# Patient Record
Sex: Male | Born: 1995 | Race: Black or African American | Hispanic: No | Marital: Single | Smoking: Never smoker
Health system: Southern US, Community
[De-identification: ages and names within clinical notes are randomized; demographics above are authoritative.]

## PROBLEM LIST (undated history)

## (undated) DIAGNOSIS — F909 Attention-deficit hyperactivity disorder, unspecified type: Secondary | ICD-10-CM

## (undated) HISTORY — DX: Attention-deficit hyperactivity disorder, unspecified type: F90.9

## (undated) HISTORY — PX: ROTATOR CUFF REPAIR: SHX139

---
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
## 2018-12-29 ENCOUNTER — Emergency Department (HOSPITAL_BASED_OUTPATIENT_CLINIC_OR_DEPARTMENT_OTHER): Payer: No Typology Code available for payment source

## 2018-12-29 ENCOUNTER — Emergency Department (HOSPITAL_BASED_OUTPATIENT_CLINIC_OR_DEPARTMENT_OTHER)
Admission: EM | Admit: 2018-12-29 | Discharge: 2018-12-29 | Disposition: A | Discharge status: Home / Self Care | Payer: No Typology Code available for payment source | Attending: Emergency Medicine | Admitting: Emergency Medicine

## 2018-12-29 ENCOUNTER — Encounter (HOSPITAL_BASED_OUTPATIENT_CLINIC_OR_DEPARTMENT_OTHER): Payer: Self-pay | Admitting: *Deleted

## 2018-12-29 ENCOUNTER — Other Ambulatory Visit: Payer: Self-pay

## 2018-12-29 DIAGNOSIS — S6391XA Sprain of unspecified part of right wrist and hand, initial encounter: Secondary | ICD-10-CM | POA: Insufficient documentation

## 2018-12-29 DIAGNOSIS — S0990XA Unspecified injury of head, initial encounter: Secondary | ICD-10-CM | POA: Insufficient documentation

## 2018-12-29 DIAGNOSIS — M7918 Myalgia, other site: Secondary | ICD-10-CM

## 2018-12-29 DIAGNOSIS — S60921A Unspecified superficial injury of right hand, initial encounter: Secondary | ICD-10-CM | POA: Diagnosis present

## 2018-12-29 DIAGNOSIS — Y92410 Unspecified street and highway as the place of occurrence of the external cause: Secondary | ICD-10-CM | POA: Diagnosis not present

## 2018-12-29 DIAGNOSIS — Y939 Activity, unspecified: Secondary | ICD-10-CM | POA: Insufficient documentation

## 2018-12-29 DIAGNOSIS — Y999 Unspecified external cause status: Secondary | ICD-10-CM | POA: Diagnosis not present

## 2018-12-29 MED ORDER — METHOCARBAMOL 500 MG PO TABS: 500.0000 mg | ORAL_TABLET | Freq: Two times a day (BID) | ORAL | 0 refills | Status: AC

## 2018-12-29 NOTE — ED Notes (Signed)
Pt ambulated to d/c window with steady gait. Rx x 1 given for Robaxin

## 2018-12-29 NOTE — Discharge Instructions (Signed)

## 2018-12-29 NOTE — ED Notes (Signed)
Pt ambulated to radiology and back without dificulty

## 2018-12-29 NOTE — ED Notes (Signed)
Radiology notified to wait until provider exam to take pt for Xray
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
## 2018-12-29 NOTE — ED Notes (Signed)
ED Provider at bedside. 
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
## 2019-01-24 ENCOUNTER — Emergency Department (HOSPITAL_BASED_OUTPATIENT_CLINIC_OR_DEPARTMENT_OTHER)
Admission: EM | Admit: 2019-01-24 | Discharge: 2019-01-24 | Disposition: A | Discharge status: Home / Self Care | Payer: Self-pay | Attending: Emergency Medicine | Admitting: Emergency Medicine

## 2019-01-24 ENCOUNTER — Other Ambulatory Visit: Payer: Self-pay

## 2019-01-24 ENCOUNTER — Encounter (HOSPITAL_BASED_OUTPATIENT_CLINIC_OR_DEPARTMENT_OTHER): Payer: Self-pay

## 2019-01-24 DIAGNOSIS — Z79899 Other long term (current) drug therapy: Secondary | ICD-10-CM | POA: Insufficient documentation

## 2019-01-24 DIAGNOSIS — R197 Diarrhea, unspecified: Secondary | ICD-10-CM | POA: Insufficient documentation

## 2019-01-24 DIAGNOSIS — R112 Nausea with vomiting, unspecified: Secondary | ICD-10-CM | POA: Insufficient documentation

## 2019-01-24 LAB — CBC WITH DIFFERENTIAL/PLATELET
Abs Immature Granulocytes: 0.02 10*3/uL (ref 0.00–0.07)
Basophils Absolute: 0 10*3/uL (ref 0.0–0.1)
Basophils Relative: 0 %
Eosinophils Absolute: 0.1 10*3/uL (ref 0.0–0.5)
Eosinophils Relative: 1 %
HEMATOCRIT: 46.3 % (ref 39.0–52.0)
Hemoglobin: 14.7 g/dL (ref 13.0–17.0)
Immature Granulocytes: 0 %
LYMPHS ABS: 2.4 10*3/uL (ref 0.7–4.0)
LYMPHS PCT: 23 %
MCH: 28.8 pg (ref 26.0–34.0)
MCHC: 31.7 g/dL (ref 30.0–36.0)
MCV: 90.6 fL (ref 80.0–100.0)
Monocytes Absolute: 0.7 10*3/uL (ref 0.1–1.0)
Monocytes Relative: 7 %
Neutro Abs: 7.3 10*3/uL (ref 1.7–7.7)
Neutrophils Relative %: 69 %
Platelets: 174 10*3/uL (ref 150–400)
RBC: 5.11 MIL/uL (ref 4.22–5.81)
RDW: 13.2 % (ref 11.5–15.5)
WBC: 10.5 10*3/uL (ref 4.0–10.5)
nRBC: 0 % (ref 0.0–0.2)

## 2019-01-24 LAB — COMPREHENSIVE METABOLIC PANEL
ALT: 20 U/L (ref 0–44)
AST: 25 U/L (ref 15–41)
Albumin: 4.5 g/dL (ref 3.5–5.0)
Alkaline Phosphatase: 56 U/L (ref 38–126)
Anion gap: 7 (ref 5–15)
BUN: 15 mg/dL (ref 6–20)
CO2: 27 mmol/L (ref 22–32)
Calcium: 9.3 mg/dL (ref 8.9–10.3)
Chloride: 105 mmol/L (ref 98–111)
Creatinine, Ser: 1.12 mg/dL (ref 0.61–1.24)
GFR calc Af Amer: >60 mL/min (ref >60)
Glucose, Bld: 101 mg/dL — ABNORMAL HIGH (ref 70–99)
Potassium: 3.9 mmol/L (ref 3.5–5.1)
Sodium: 139 mmol/L (ref 135–145)
Total Bilirubin: 0.7 mg/dL (ref 0.3–1.2)
Total Protein: 7 g/dL (ref 6.5–8.1)

## 2019-01-24 LAB — LIPASE, BLOOD: Lipase: 34 U/L (ref 11–51)

## 2019-01-24 MED ORDER — SODIUM CHLORIDE 0.9 % IV BOLUS
1000.0000 mL | Freq: Once | INTRAVENOUS | Status: AC
  Administered 2019-01-24: 1000 mL via INTRAVENOUS

## 2019-01-24 MED ORDER — ONDANSETRON HCL 4 MG/2ML IJ SOLN
4.0000 mg | Freq: Once | INTRAMUSCULAR | Status: AC
  Administered 2019-01-24: 4 mg via INTRAVENOUS
  Filled 2019-01-24: qty 2

## 2019-01-24 MED ORDER — ONDANSETRON 4 MG PO TBDP: ORAL_TABLET | ORAL | 0 refills | Status: AC

## 2019-01-24 NOTE — ED Notes (Signed)
NAD at this time. Pt is stable and going home.  

## 2019-01-24 NOTE — ED Provider Notes (Signed)
[facility] EMERGENCY DEPARTMENT Provider Note   CSN: [id number] Arrival date & time: 01/24/19  1617    History   Chief Complaint Chief Complaint  Patient presents with  . Emesis    HPI Craig Luna is a 23 y.o. male hx of ADHD here presenting with vomiting, diarrhea.  Patient states that for the last 2 days he has been having nonbilious and nonbloody vomiting.  He states that he was unable to keep any food down but did not try to drink much fluids.  Also has several episodes of watery diarrhea as well.  Also has some diffuse abdominal cramps.  Patient denies any recent travel or eating uncooked meat.  Patient states that multiple family members are sick with similar symptoms.  Denies any fevers or previous abdominal surgeries.      The history is provided by the patient.    Past Medical History:  Diagnosis Date  . ADHD (attention deficit hyperactivity disorder)    On Concerta. His grandmother, who is guardian, does not want him to be told that he has ADHD.     Patient Active Problem List   Diagnosis Date Noted  . ACNE, MILD 11/27/2010  . ALLERGIC CONJUNCTIVITIS 03/01/2008  . ATTENTION DEFICIT, W/HYPERACTIVITY 02/03/2007    Past Surgical History:  Procedure Laterality Date  . ROTATOR CUFF REPAIR          Home Medications    Prior to Admission medications   Medication Sig Start Date End Date Taking? Authorizing Provider  methocarbamol (ROBAXIN) 500 MG tablet Take 1 tablet (500 mg total) by mouth 2 (two) times daily. 12/29/18   Maxwell Caul, PA-C  methylphenidate (CONCERTA) 54 MG CR tablet Take 1 tablet (54 mg total) by mouth every morning. 05/26/13   Everrett Coombe, DO  methylphenidate (CONCERTA) 54 MG CR tablet Take 1 tablet (54 mg total) by mouth every morning. 05/26/13   Everrett Coombe, DO  methylphenidate (CONCERTA) 54 MG CR tablet Take 1 tablet (54 mg total) by mouth every morning. 05/26/13   Everrett Coombe, DO  methylphenidate (CONCERTA) 54 MG  CR tablet Take 1 tablet (54 mg total) by mouth every morning. 05/26/13   Everrett Coombe, DO  olopatadine ([name]) 0.1 % ophthalmic solution Place 1 drop into both eyes 2 (two) times daily as needed. 05/26/13   Everrett Coombe, DO    Family History Family History  Problem Relation Age of Onset  . Eczema Brother     Social History Social History   Tobacco Use  . Smoking status: Never Smoker  . Smokeless tobacco: Never Used  Substance Use Topics  . Alcohol use: No  . Drug use: No     Allergies   Patient has no known allergies.   Review of Systems Review of Systems  Gastrointestinal: Positive for diarrhea and vomiting.  All other systems reviewed and are negative.    Physical Exam Updated Vital Signs BP 119/66 (BP Location: Left Arm)   Pulse 63   Temp 99.2 F (37.3 C) (Oral)   Resp 16   Ht 6\' 1"  (1.854 m)   Wt 81.6 kg   SpO2 100%   BMI 23.75 kg/m   Physical Exam Vitals signs and nursing note reviewed.  HENT:     Head: Normocephalic.     Mouth/Throat:     Mouth: Mucous membranes are dry.  Eyes:     Extraocular Movements: Extraocular movements intact.     Pupils: Pupils are equal, round, and reactive to light.  Neck:     Musculoskeletal: Normal range of motion.  Cardiovascular:     Rate and Rhythm: Regular rhythm. Tachycardia present.  Pulmonary:     Effort: Pulmonary effort is normal.     Breath sounds: Normal breath sounds.  Abdominal:     General: Abdomen is flat.     Palpations: Abdomen is soft.  Musculoskeletal: Normal range of motion.  Skin:    General: Skin is warm.     Capillary Refill: Capillary refill takes less than 2 seconds.  Neurological:     General: No focal deficit present.     Mental Status: He is alert.  Psychiatric:        Mood and Affect: Mood normal.      ED Treatments / Results  Labs (all labs ordered are listed, but only abnormal results are displayed) Labs Reviewed  COMPREHENSIVE METABOLIC PANEL - Abnormal; Notable for  the following components:      Result Value   Glucose, Bld 101 (*)    All other components within normal limits  CBC WITH DIFFERENTIAL/PLATELET  LIPASE, BLOOD  URINALYSIS, ROUTINE W REFLEX MICROSCOPIC    EKG None  Radiology No results found.  Procedures Procedures (including critical care time)  Medications Ordered in ED Medications  sodium chloride 0.9 % bolus 1,000 mL (1,000 mLs Intravenous New Bag/Given 01/24/19 1709)  ondansetron (ZOFRAN) injection 4 mg (4 mg Intravenous Given 01/24/19 1710)     Initial Impression / Assessment and Plan / ED Course  I have reviewed the triage vital signs and the nursing notes.  Pertinent labs & imaging results that were available during my care of the patient were reviewed by me and considered in my medical decision making (see chart for details).       Craig Luna is a 23 y.o. male here with vomiting, diarrhea. Likely viral gastro. Abdomen nontender. Patient appears dehydrated and slightly tachycardic. Will get labs, UA. Will give IVF, zofran and PO trial.   6:20 PM Tolerated PO in the ED. HR normal after IVF. Labs unremarkable. Will dc home with zofran prn. Likely viral gastro.    Final Clinical Impressions(s) / ED Diagnoses   Final diagnoses:  None    ED Discharge Orders    None       Craig Pander, MD 01/24/19 1821

## 2019-01-24 NOTE — ED Triage Notes (Signed)
C/o n/v/d day 2-NAD-steady gait

## 2019-01-24 NOTE — Discharge Instructions (Signed)
Take zofran for nausea.   Stay hydrated.   See your doctor  Return to ER if you have worse vomiting, abdominal pain, dehydration.
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
